# Patient Record
Sex: Female | Born: 1955 | Race: White | Hispanic: No | Marital: Married | Smoking: Never smoker
Health system: Southern US, Community
[De-identification: ages and names within clinical notes are randomized; demographics above are authoritative.]

## PROBLEM LIST (undated history)

## (undated) DIAGNOSIS — E119 Type 2 diabetes mellitus without complications: Secondary | ICD-10-CM

## (undated) DIAGNOSIS — M797 Fibromyalgia: Secondary | ICD-10-CM

## (undated) DIAGNOSIS — C801 Malignant (primary) neoplasm, unspecified: Secondary | ICD-10-CM

## (undated) HISTORY — PX: ABDOMINAL HYSTERECTOMY: SHX81

## (undated) HISTORY — PX: BILE DUCT STENT PLACEMENT: SHX1227

## (undated) HISTORY — PX: LAPAROSCOPIC GASTRIC BAND REMOVAL WITH LAPAROSCOPIC GASTRIC SLEEVE RESECTION: SHX6498

## (undated) HISTORY — PX: TONSILLECTOMY: SUR1361

---

## 2018-11-16 ENCOUNTER — Emergency Department
Admission: EM | Admit: 2018-11-16 | Discharge: 2018-11-17 | Disposition: A | Discharge status: Home / Self Care | Attending: Emergency Medicine | Admitting: Emergency Medicine

## 2018-11-16 ENCOUNTER — Emergency Department

## 2018-11-16 ENCOUNTER — Encounter: Payer: Self-pay | Admitting: Emergency Medicine

## 2018-11-16 DIAGNOSIS — D72829 Elevated white blood cell count, unspecified: Secondary | ICD-10-CM | POA: Diagnosis not present

## 2018-11-16 DIAGNOSIS — R1084 Generalized abdominal pain: Secondary | ICD-10-CM

## 2018-11-16 DIAGNOSIS — Z79899 Other long term (current) drug therapy: Secondary | ICD-10-CM | POA: Diagnosis not present

## 2018-11-16 DIAGNOSIS — E119 Type 2 diabetes mellitus without complications: Secondary | ICD-10-CM | POA: Insufficient documentation

## 2018-11-16 DIAGNOSIS — Z8507 Personal history of malignant neoplasm of pancreas: Secondary | ICD-10-CM | POA: Diagnosis not present

## 2018-11-16 HISTORY — DX: Fibromyalgia: M79.7

## 2018-11-16 HISTORY — DX: Type 2 diabetes mellitus without complications: E11.9

## 2018-11-16 HISTORY — DX: Malignant (primary) neoplasm, unspecified: C80.1

## 2018-11-16 LAB — CBC
HCT: 40.3 % (ref 36.0–46.0)
Hemoglobin: 13.6 g/dL (ref 12.0–15.0)
MCH: 29.2 pg (ref 26.0–34.0)
MCHC: 33.7 g/dL (ref 30.0–36.0)
MCV: 86.7 fL (ref 80.0–100.0)
Platelets: 443 10*3/uL — ABNORMAL HIGH (ref 150–400)
RBC: 4.65 MIL/uL (ref 3.87–5.11)
RDW: 13.5 % (ref 11.5–15.5)
WBC: 20.1 10*3/uL — ABNORMAL HIGH (ref 4.0–10.5)
nRBC: 0 % (ref 0.0–0.2)

## 2018-11-16 LAB — COMPREHENSIVE METABOLIC PANEL
ALT: 34 U/L (ref 0–44)
AST: 25 U/L (ref 15–41)
Albumin: 4.7 g/dL (ref 3.5–5.0)
Alkaline Phosphatase: 184 U/L — ABNORMAL HIGH (ref 38–126)
Anion gap: 13 (ref 5–15)
BUN: 16 mg/dL (ref 8–23)
CO2: 24 mmol/L (ref 22–32)
Calcium: 9.9 mg/dL (ref 8.9–10.3)
Chloride: 99 mmol/L (ref 98–111)
Creatinine, Ser: 0.56 mg/dL (ref 0.44–1.00)
GFR calc Af Amer: >60 mL/min (ref >60)
GFR calc non Af Amer: >60 mL/min (ref >60)
Glucose, Bld: 241 mg/dL — ABNORMAL HIGH (ref 70–99)
Potassium: 3.9 mmol/L (ref 3.5–5.1)
Sodium: 136 mmol/L (ref 135–145)
Total Bilirubin: 0.8 mg/dL (ref 0.3–1.2)
Total Protein: 8 g/dL (ref 6.5–8.1)

## 2018-11-16 LAB — URINALYSIS, COMPLETE (UACMP) WITH MICROSCOPIC
BACTERIA UA: NONE SEEN
BILIRUBIN URINE: NEGATIVE
Glucose, UA: >=500 mg/dL — AB
Hgb urine dipstick: NEGATIVE
Ketones, ur: 80 mg/dL — AB
Leukocytes, UA: NEGATIVE
Nitrite: NEGATIVE
Protein, ur: NEGATIVE mg/dL
Specific Gravity, Urine: 1.021 (ref 1.005–1.030)
pH: 6 (ref 5.0–8.0)

## 2018-11-16 LAB — LIPASE, BLOOD: Lipase: 27 U/L (ref 11–51)

## 2018-11-16 LAB — GLUCOSE, CAPILLARY: GLUCOSE-CAPILLARY: 222 mg/dL — AB (ref 70–99)

## 2018-11-16 MED ORDER — IOPAMIDOL (ISOVUE-300) INJECTION 61%: 15.0000 mL | INTRAVENOUS | Status: AC

## 2018-11-16 MED ORDER — HYDROMORPHONE HCL 1 MG/ML IJ SOLN
0.5000 mg | Freq: Once | INTRAMUSCULAR | Status: AC
  Administered 2018-11-16: 0.5 mg via INTRAVENOUS
  Filled 2018-11-16: qty 1

## 2018-11-16 MED ORDER — MORPHINE SULFATE (PF) 2 MG/ML IV SOLN
INTRAVENOUS | Status: AC
  Filled 2018-11-16: qty 2

## 2018-11-16 MED ORDER — MORPHINE SULFATE (PF) 4 MG/ML IV SOLN
4.0000 mg | Freq: Once | INTRAVENOUS | Status: AC
  Administered 2018-11-16: 4 mg via INTRAVENOUS

## 2018-11-16 MED ORDER — ONDANSETRON HCL 4 MG/2ML IJ SOLN
INTRAMUSCULAR | Status: AC
  Filled 2018-11-16: qty 2

## 2018-11-16 MED ORDER — ONDANSETRON HCL 4 MG/2ML IJ SOLN
4.0000 mg | Freq: Once | INTRAMUSCULAR | Status: AC
  Administered 2018-11-16: 4 mg via INTRAVENOUS

## 2018-11-16 MED ORDER — IOPAMIDOL (ISOVUE-300) INJECTION 61%
100.0000 mL | Freq: Once | INTRAVENOUS | Status: AC | PRN
  Administered 2018-11-16: 100 mL via INTRAVENOUS

## 2018-11-16 MED ORDER — ONDANSETRON HCL 4 MG/2ML IJ SOLN
4.0000 mg | Freq: Once | INTRAMUSCULAR | Status: AC
  Administered 2018-11-16: 4 mg via INTRAVENOUS
  Filled 2018-11-16: qty 2

## 2018-11-16 MED ORDER — PIPERACILLIN-TAZOBACTAM 3.375 G IVPB 30 MIN
3.3750 g | Freq: Once | INTRAVENOUS | Status: AC
  Administered 2018-11-16: 3.375 g via INTRAVENOUS
  Filled 2018-11-16: qty 50

## 2018-11-16 NOTE — ED Notes (Signed)
Reviewed pt's cc, results; charge called for next available exam room

## 2018-11-16 NOTE — ED Notes (Signed)
PT BACK FROM CT AT THIS TIME

## 2018-11-16 NOTE — ED Provider Notes (Signed)
-----------------------------------------   11:15 PM on 11/16/2018 -----------------------------------------   Assuming care from Dr. Cinda Quest.  In short, Tracey Robertson is a 62 y.o. female with a chief complaint of abdominal pain.  Refer to the original H&P for additional details.  The current plan of care is to follow up CT scan and reassess.  Patient has history of pancreatic cancer and is followed at [facility].   ----------------------------------------- 2:05 AM on 11/17/2018 -----------------------------------------  The patient CT scan was generally reassuring with no obvious acute abnormalities and findings consistent with her recent procedure.  She is remained afebrile and not tachycardic.  She is still having some moderate abdominal pain.  I discussed the findings with her and suggested either transfer to [facility] for additional evaluation or discharge home for close outpatient follow-up.  She has an appointment scheduled at 9:00 in the morning which is about 7 hours from now.  She would prefer to go home and follow-up as an outpatient which I feel is appropriate.  I gave strict return precautions as well as another dose of morphine 4 mg IV and 2 Percocet.  She understands and agrees with plan.  Other than her leukocytosis of 20, which could be postprocedural and reactive, her work-up was generally reassuring.   Hinda Kehr, MD 11/17/18 [phone]

## 2018-11-16 NOTE — ED Provider Notes (Addendum)
[facility] Emergency Department Provider Note   ____________________________________________   First MD Initiated Contact with Patient 11/16/18 2026     (approximate)  I have reviewed the triage vital signs and the nursing notes.   HISTORY  Chief Complaint Abdominal Pain and Nausea    HPI Tracey Robertson is a 62 y.o. female complains abdominal pain nausea and some vomiting.  This is been going on since she had a metal stent placed 10 her common bile duct.  This was done at [facility].  Patient has a history of pancreatic cancer.  Apparently is wrapping around the common bile duct on destructing it and also wrapping around 1 of the blood vessels there.  She had a plastic stent placed earlier and had a milder reaction like this got pain medicine went home had to have the metal stent placed then because the plastic 1 would have been incompatible with chemo which she will need first before a Whipple procedure.  Now she is having increased pain and the nausea and vomiting.  Pain is 10 out of 10 made worse with palpation percussion.  She is not having a fever  Past Medical History:  Diagnosis Date  . Cancer [facility])    Pancreatic Cancer  . Diabetes mellitus without complication ([place])   . Fibromyalgia     There are no active problems to display for this patient.   Past Surgical History:  Procedure Laterality Date  . ABDOMINAL HYSTERECTOMY    . BILE DUCT STENT PLACEMENT    . LAPAROSCOPIC GASTRIC BAND REMOVAL WITH LAPAROSCOPIC GASTRIC SLEEVE RESECTION    . TONSILLECTOMY      Prior to Admission medications   Medication Sig Start Date End Date Taking? Authorizing Provider  insulin aspart (NOVOLOG) 100 UNIT/ML FlexPen Inject into the skin. 11/14/18 11/14/19 Yes [provider]  Acidophilus Lactobacillus CAPS Take by mouth.    [provider]  Cholecalciferol (VITAMIN D) 50 MCG (2000 UT) tablet Take by mouth.    [provider]  cyanocobalamin  100 MCG tablet Take by mouth.    [provider]  docusate sodium (COLACE) 100 MG capsule Take 1 tablet once or twice daily as needed for constipation while taking narcotic pain medicine 11/17/18   Hinda Kehr, MD  DULoxetine (CYMBALTA) 30 MG capsule Take by mouth.    [provider]  glipiZIDE (GLUCOTROL) 5 MG tablet Take by mouth.    [provider]  lisinopril (PRINIVIL,ZESTRIL) 5 MG tablet Take by mouth.    [provider]  oxyCODONE-acetaminophen (PERCOCET) 5-325 MG tablet Take 1-2 tablets by mouth every 6 (six) hours as needed for severe pain. 11/17/18   Hinda Kehr, MD    Allergies Metformin and related; Simvastatin; and Adhesive [tape]  History reviewed. No pertinent family history.  Social History Social History   Tobacco Use  . Smoking status: Never Smoker  . Smokeless tobacco: Never Used  Substance Use Topics  . Alcohol use: Yes    Comment: rarely  . Drug use: Not on file    Review of Systems  Constitutional: No fever/chills Eyes: No visual changes. ENT: No sore throat. Cardiovascular: Denies chest pain. Respiratory: Denies shortness of breath. Gastrointestinal: See HPI Genitourinary: Negative for dysuria. Musculoskeletal: Negative for back pain. Skin: Negative for rash. Neurological: Negative for headaches, focal weakness  ____________________________________________   PHYSICAL EXAM:  VITAL SIGNS: ED Triage Vitals  Enc Vitals Group     BP 11/16/18 1754 (!) 165/88  Pulse Rate 11/16/18 1754 64     Resp 11/16/18 1754 18     Temp 11/16/18 1754 98.9 F (37.2 C)     Temp Source 11/16/18 1754 Oral     SpO2 11/16/18 1754 100 %     Weight 11/16/18 1756 119 lb (54 kg)     Height 11/16/18 1756 5\' 3"  (1.6 m)     Head Circumference --      Peak Flow --      Pain Score 11/16/18 1756 8     Pain Loc --      Pain Edu? --      Excl. in [place]? --     Constitutional: Alert and oriented.  Looks uncomfortable and in  pain Eyes: Conjunctivae are normal.  Head: Atraumatic. Nose: No congestion/rhinnorhea. Mouth/Throat: Mucous membranes are moist.  Oropharynx non-erythematous. Neck: No stridor Cardiovascular: Normal rate, regular rhythm. Grossly normal heart sounds.  Good peripheral circulation. Respiratory: Normal respiratory effort.  No retractions. Lungs CTAB. Gastrointestinal: Soft tender to palpation percussion of the right upper quadrant and the right CVA area no distention. No abdominal bruits.  Musculoskeletal: No lower extremity tenderness nor edema.  No joint effusions. Neurologic:  Normal speech and language. No gross focal neurologic deficits are appreciated. Skin:  Skin is warm, dry and intact. No rash noted. Psychiatric: Mood and affect are normal. Speech and behavior are normal.  ____________________________________________   LABS (all labs ordered are listed, but only abnormal results are displayed)  Labs Reviewed  COMPREHENSIVE METABOLIC PANEL - Abnormal; Notable for the following components:      Result Value   Glucose, Bld 241 (*)    Alkaline Phosphatase 184 (*)    All other components within normal limits  CBC - Abnormal; Notable for the following components:   WBC 20.1 (*)    Platelets 443 (*)    All other components within normal limits  URINALYSIS, COMPLETE (UACMP) WITH MICROSCOPIC - Abnormal; Notable for the following components:   Color, Urine YELLOW (*)    APPearance HAZY (*)    Glucose, UA >=500 (*)    Ketones, ur 80 (*)    All other components within normal limits  GLUCOSE, CAPILLARY - Abnormal; Notable for the following components:   Glucose-Capillary 222 (*)    All other components within normal limits  LIPASE, BLOOD   ____________________________________________  EKG EKG read and interpreted by me shows normal sinus rhythm rate of 66 normal axis essentially normal EKGs although there appear to be U waves present in several  leads. _______________________________________  RADIOLOGY  ED MD interpretation:    Official radiology report(s): Ct Abdomen Pelvis W Contrast  Result Date: 11/17/2018 CLINICAL DATA:  Status post biliary stent placement. History of pancreatic cancer. EXAM: CT ABDOMEN AND PELVIS WITH CONTRAST TECHNIQUE: Multidetector CT imaging of the abdomen and pelvis was performed using the standard protocol following bolus administration of intravenous contrast. CONTRAST:  148mL ISOVUE-300 IOPAMIDOL (ISOVUE-300) INJECTION 61% COMPARISON:  None. FINDINGS: LOWER CHEST: There is no basilar pleural or apical pericardial effusion. HEPATOBILIARY: The hepatic contours and density are normal. There is no intra- or extrahepatic biliary dilatation. There is pneumobilia. Metallic common bile duct stent extends from the proximal common bile duct into the duodenum. There is hyperattenuating material within the gallbladder. PANCREAS: Pancreatic duct is dilated. There are multiple cystic foci of the pancreatic neck, measuring up to 2.8 x 0.8 cm. SPLEEN: Normal. ADRENALS/URINARY TRACT: --Adrenal glands: Normal. --Right kidney/ureter: No hydronephrosis, nephroureterolithiasis, perinephric stranding or solid  renal mass. --Left kidney/ureter: No hydronephrosis, nephroureterolithiasis, perinephric stranding or solid renal mass. --Urinary bladder: Normal for degree of distention STOMACH/BOWEL: --Stomach/Duodenum: There is no hiatal hernia or other gastric abnormality. The duodenal course and caliber are normal. --Small bowel: No dilatation or inflammation. --Colon: No focal abnormality. --Appendix: Not visualized. No right lower quadrant inflammation or free fluid. VASCULAR/LYMPHATIC: Normal course and caliber of the major abdominal vessels. No abdominal or pelvic lymphadenopathy. REPRODUCTIVE: Status post hysterectomy. No adnexal mass. MUSCULOSKELETAL. No bony spinal canal stenosis or focal osseous abnormality. OTHER: None. IMPRESSION: 1.  Multi-cystic appearance of the pancreatic head/neck with dilatation of the pancreatic duct, likely secondary to known pancreatic carcinoma. 2. Common bile duct metallic stent with expected pneumobilia. No intrahepatic or extrahepatic biliary dilatation. 3. Hyperdense material in the gallbladder may indicate blood or excreted contrast material. Electronically Signed   By: Ulyses Jarred M.D.   On: 11/17/2018 00:10    ____________________________________________   PROCEDURES  Procedure(s) performed:   Procedures  Critical Care performed:  ____________________________________________   INITIAL IMPRESSION / ASSESSMENT AND PLAN / ED COURSE   Pt signed out to Dr Karma Greaser pending results of tests.        ____________________________________________   FINAL CLINICAL IMPRESSION(S) / ED DIAGNOSES  Final diagnoses:  Generalized abdominal pain  Leukocytosis, unspecified type     ED Discharge Orders         Ordered    oxyCODONE-acetaminophen (PERCOCET) 5-325 MG tablet  Every 6 hours PRN     11/17/18 0208    docusate sodium (COLACE) 100 MG capsule     11/17/18 0208           Note:  This document was prepared using Dragon voice recognition software and may include unintentional dictation errors.    Nena Polio, MD 11/17/18 4166    Nena Polio, MD 11/27/18 [phone]

## 2018-11-16 NOTE — ED Triage Notes (Signed)
Patient presents to the ED after having metal stent placed in her bile duct yesterday.  Patient states since the surgery, patient reports nausea, vomiting and extreme abdominal pain.  Patient states surgery was done at [facility] but last time patient went to the [facility] ER she had to wait 13 hours.  Patient has history of pancreatic cancer.

## 2018-11-17 MED ORDER — MORPHINE SULFATE (PF) 4 MG/ML IV SOLN
4.0000 mg | Freq: Once | INTRAVENOUS | Status: AC
  Administered 2018-11-17: 4 mg via INTRAVENOUS
  Filled 2018-11-17: qty 1

## 2018-11-17 MED ORDER — DOCUSATE SODIUM 100 MG PO CAPS: ORAL_CAPSULE | ORAL | 0 refills | Status: AC

## 2018-11-17 MED ORDER — OXYCODONE-ACETAMINOPHEN 5-325 MG PO TABS
2.0000 | ORAL_TABLET | Freq: Once | ORAL | Status: AC
  Administered 2018-11-17: 2 via ORAL
  Filled 2018-11-17: qty 2

## 2018-11-17 MED ORDER — OXYCODONE-ACETAMINOPHEN 5-325 MG PO TABS: 1.0000 | ORAL_TABLET | Freq: Four times a day (QID) | ORAL | 0 refills | Status: AC | PRN

## 2018-11-17 NOTE — Discharge Instructions (Addendum)
Your workup in the Emergency Department today was reassuring.  We did not find any specific abnormalities other than an elevated white blood cell count, which could be reactive from your recent procedure or could be an indication of infection.  The doctor who saw you initially gave you a dose of Zosyn 3.375 g IV (a strong antibiotic), and you should let your doctor know that you received this.  I discussed with you whether or not you wanted to be transferred to [facility] for further evaluation and work-up, but you prefer to follow-up at this morning with your doctor which we think is appropriate.  We recommend you drink plenty of fluids, take your regular medications and/or any new ones prescribed today, and follow up with the doctor(s) listed in these documents as recommended.  Take Percocet as prescribed for severe pain. Do not drink alcohol, drive or participate in any other potentially dangerous activities while taking this medication as it may make you sleepy. Do not take this medication with any other sedating medications, either prescription or over-the-counter. If you were prescribed Percocet or Vicodin, do not take these with acetaminophen (Tylenol) as it is already contained within these medications.   This medication is an opiate (or narcotic) pain medication and can be habit forming.  Use it as little as possible to achieve adequate pain control.  Do not use or use it with extreme caution if you have a history of opiate abuse or dependence.  If you are on a pain contract with your primary care doctor or a pain specialist, be sure to let them know you were prescribed this medication today from the [facility] Emergency Department.  This medication is intended for your use only - do not give any to anyone else and keep it in a secure place where nobody else, especially children, have access to it.  It will also cause or worsen constipation, so you may want to consider taking an over-the-counter stool  softener while you are taking this medication.  Return to the Emergency Department if you develop new or worsening symptoms that concern you.

## 2019-05-27 IMAGING — CT CT ABD-PELV W/ CM
2 of 5 series · 16 of 46 positions shown, 18 images · IV contrast (APPLIED)
Comparison: None.

CLINICAL DATA: Status post biliary stent placement. History of
pancreatic cancer.

EXAM:
CT ABDOMEN AND PELVIS WITH CONTRAST
TECHNIQUE: Multidetector CT imaging of the abdomen and pelvis was performed
using the standard protocol following bolus administration of
intravenous contrast.
CONTRAST:  100mL O5CLVB-FVV IOPAMIDOL (O5CLVB-FVV) INJECTION 61%

[Series 2: routine abd/pel with · axial · 0.70mm/px · z∈[-848,-498]mm · 13 of 80 slices shown, 15 images]
[im 5/80  soft-tissue]
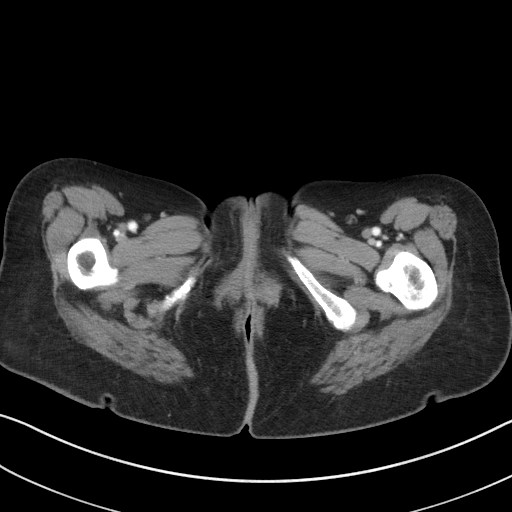
[im 5/80  bone]
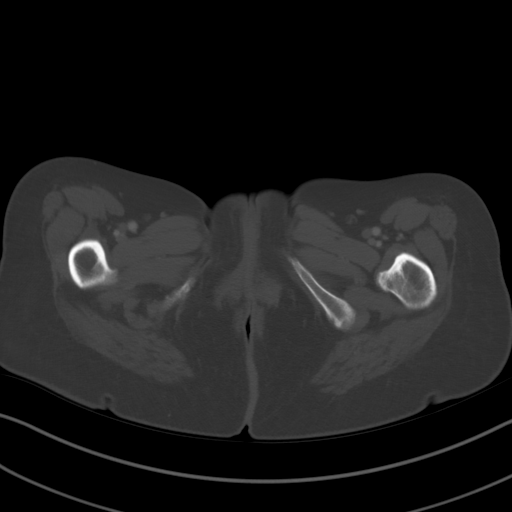
[im 13/80  soft-tissue]
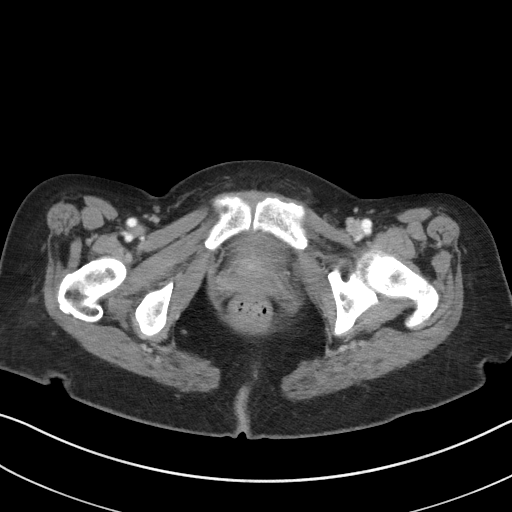
[im 17/80  soft-tissue]
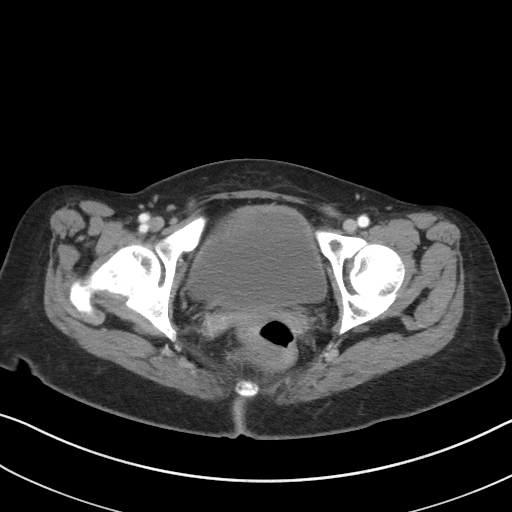
[im 21/80  soft-tissue]
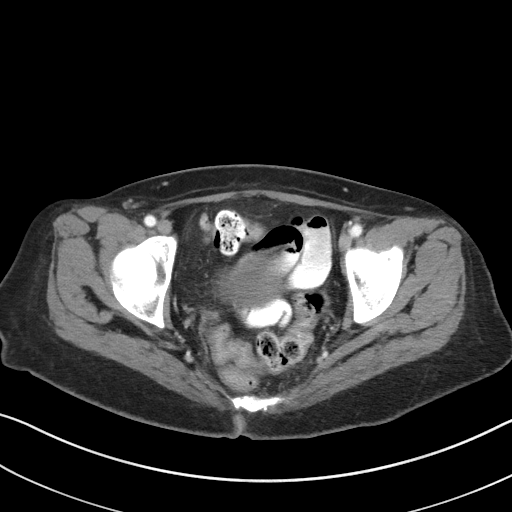
[im 30/80  soft-tissue]
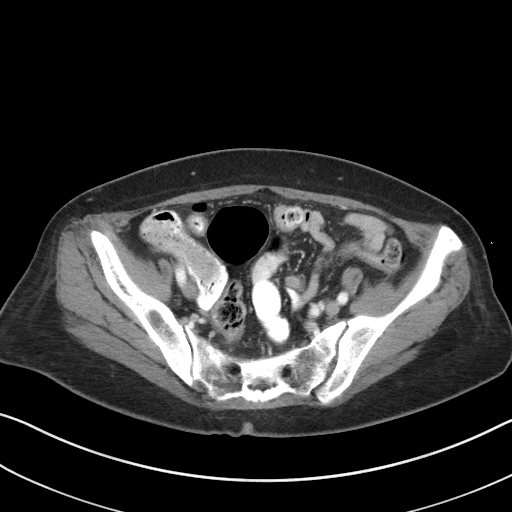
[im 34/80  soft-tissue]
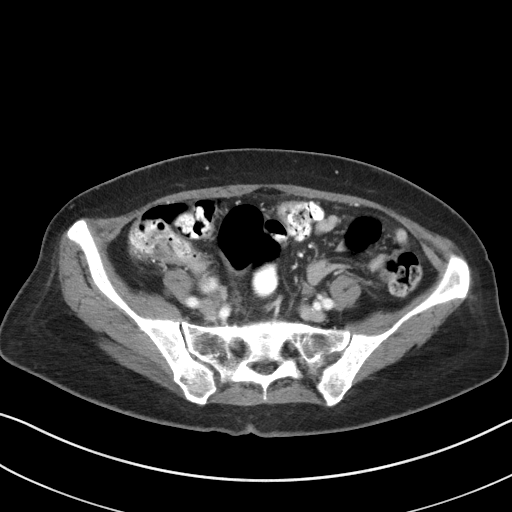
[im 42/80  soft-tissue]
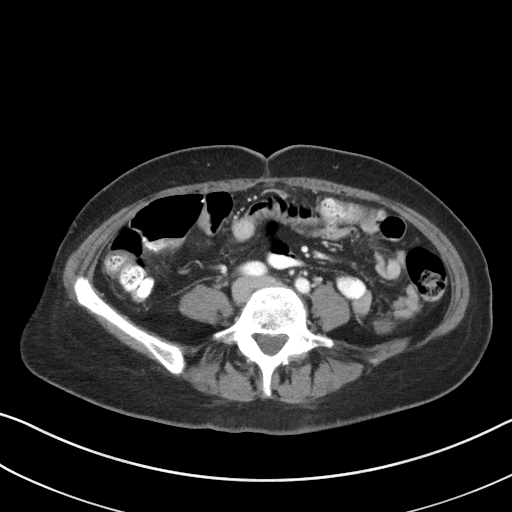
[im 46/80  soft-tissue]
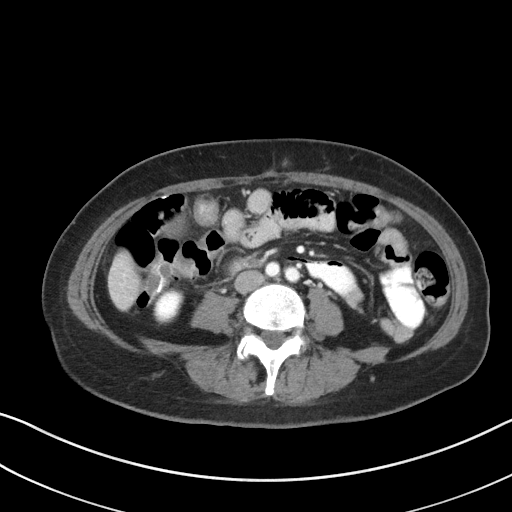
[im 50/80  soft-tissue]
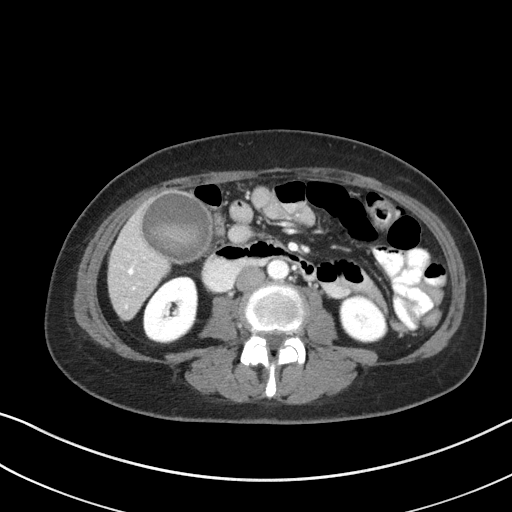
[im 50/80  bone]
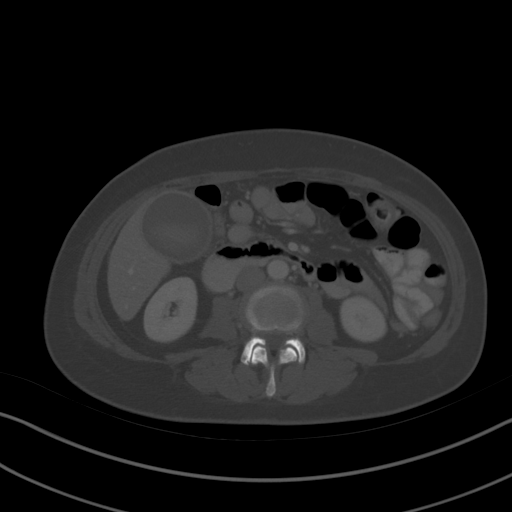
[im 59/80  soft-tissue]
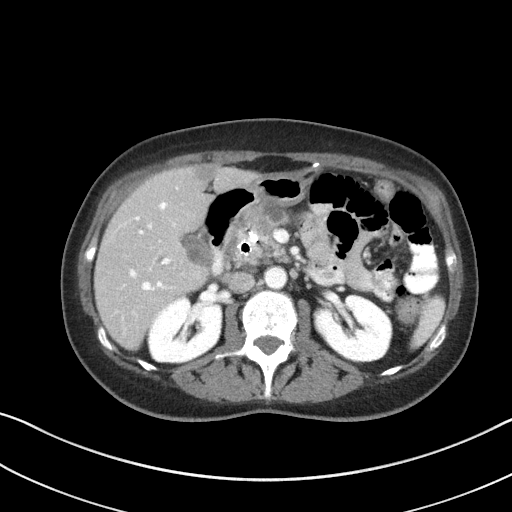
[im 63/80  soft-tissue]
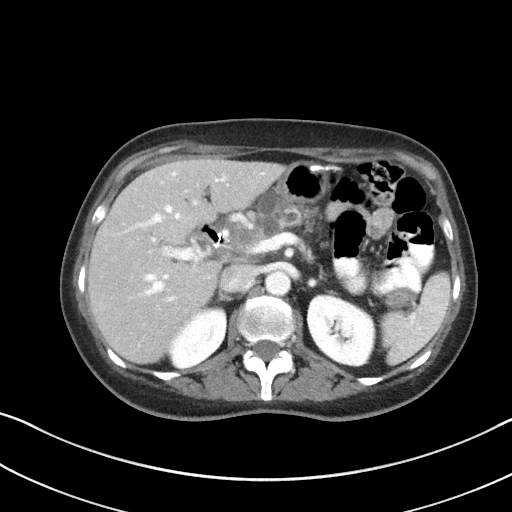
[im 67/80  soft-tissue]
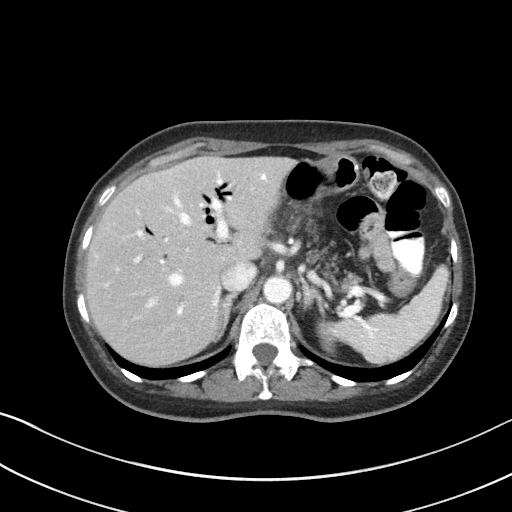
[im 75/80  soft-tissue]
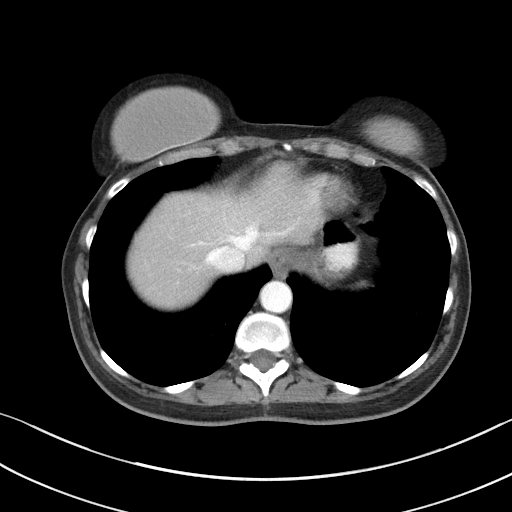

[Series 5: coronal st · coronal · 0.74mm/px · 3 of 72 slices shown]
[im 24/72  soft-tissue]
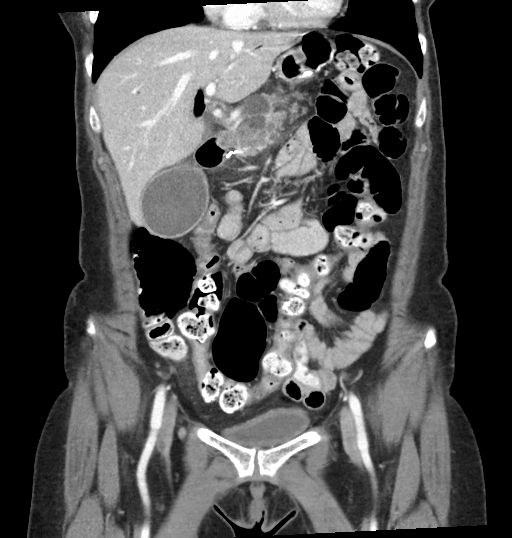
[im 32/72  soft-tissue]
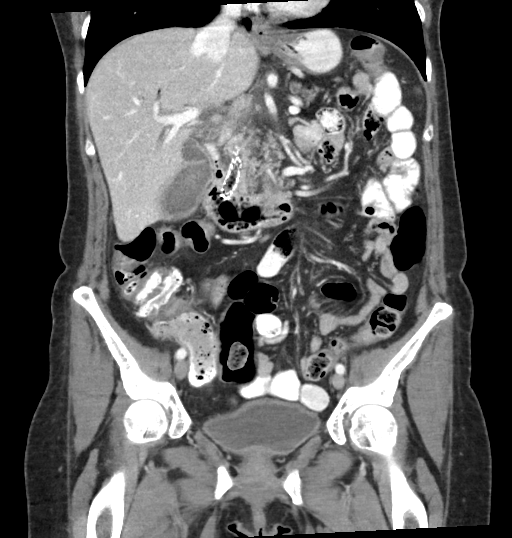
[im 40/72  soft-tissue]
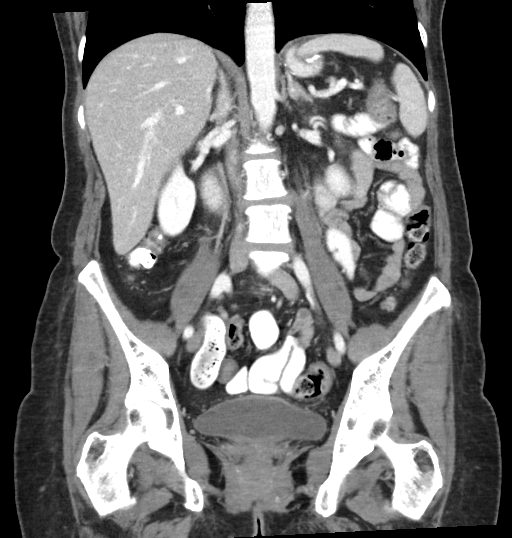

[16 of 46 positions shown; findings below may reference images not displayed]

FINDINGS: LOWER CHEST: There is no basilar pleural or apical pericardial
effusion.

HEPATOBILIARY: The hepatic contours and density are normal. There is
no intra- or extrahepatic biliary dilatation. There is pneumobilia.
Metallic common bile duct stent extends from the proximal common
bile duct into the duodenum. There is hyperattenuating material
within the gallbladder.

PANCREAS: Pancreatic duct is dilated. There are multiple cystic foci
of the pancreatic neck, measuring up to 2.8 x 0.8 cm.

SPLEEN: Normal.

ADRENALS/URINARY TRACT:

--Adrenal glands: Normal.

--Right kidney/ureter: No hydronephrosis, nephroureterolithiasis,
perinephric stranding or solid renal mass.

--Left kidney/ureter: No hydronephrosis, nephroureterolithiasis,
perinephric stranding or solid renal mass.

--Urinary bladder: Normal for degree of distention

STOMACH/BOWEL:

--Stomach/Duodenum: There is no hiatal hernia or other gastric
abnormality. The duodenal course and caliber are normal.

--Small bowel: No dilatation or inflammation.

--Colon: No focal abnormality.

--Appendix: Not visualized. No right lower quadrant inflammation or
free fluid.

VASCULAR/LYMPHATIC: Normal course and caliber of the major abdominal
vessels. No abdominal or pelvic lymphadenopathy.

REPRODUCTIVE: Status post hysterectomy. No adnexal mass.

MUSCULOSKELETAL. No bony spinal canal stenosis or focal osseous
abnormality.

OTHER: None.
IMPRESSION: 1. Multi-cystic appearance of the pancreatic head/neck with
dilatation of the pancreatic duct, likely secondary to known
pancreatic carcinoma.
2. Common bile duct metallic stent with expected pneumobilia. No
intrahepatic or extrahepatic biliary dilatation.
3. Hyperdense material in the gallbladder may indicate blood or
excreted contrast material.
